# Patient Record
Sex: Female | Born: 1960 | Hispanic: No | Marital: Single | Smoking: Never smoker
Health system: Southern US, Community
[De-identification: ages and names within clinical notes are randomized; demographics above are authoritative.]

## PROBLEM LIST (undated history)

## (undated) DIAGNOSIS — E119 Type 2 diabetes mellitus without complications: Secondary | ICD-10-CM

## (undated) DIAGNOSIS — T7840XA Allergy, unspecified, initial encounter: Secondary | ICD-10-CM

## (undated) DIAGNOSIS — G510 Bell's palsy: Secondary | ICD-10-CM

## (undated) DIAGNOSIS — K219 Gastro-esophageal reflux disease without esophagitis: Secondary | ICD-10-CM

## (undated) DIAGNOSIS — I10 Essential (primary) hypertension: Secondary | ICD-10-CM

## (undated) HISTORY — DX: Gastro-esophageal reflux disease without esophagitis: K21.9

## (undated) HISTORY — PX: CHOLECYSTECTOMY: SHX55

## (undated) HISTORY — PX: TONSILLECTOMY: SUR1361

## (undated) HISTORY — DX: Bell's palsy: G51.0

## (undated) HISTORY — DX: Allergy, unspecified, initial encounter: T78.40XA

## (undated) HISTORY — DX: Type 2 diabetes mellitus without complications: E11.9

## (undated) HISTORY — PX: INCISE AND DRAIN ABCESS: PRO64

## (undated) HISTORY — DX: Essential (primary) hypertension: I10

---

## 2010-02-22 ENCOUNTER — Ambulatory Visit: Payer: Self-pay | Admitting: Vascular Surgery

## 2010-05-25 ENCOUNTER — Ambulatory Visit: Admit: 2010-05-25 | Payer: Self-pay | Admitting: Vascular Surgery

## 2010-05-25 ENCOUNTER — Ambulatory Visit
Admission: RE | Admit: 2010-05-25 | Discharge: 2010-05-25 | Payer: Self-pay | Source: Home / Self Care | Attending: Vascular Surgery | Admitting: Vascular Surgery

## 2010-07-05 ENCOUNTER — Other Ambulatory Visit (INDEPENDENT_AMBULATORY_CARE_PROVIDER_SITE_OTHER): Payer: PRIVATE HEALTH INSURANCE | Admitting: Vascular Surgery

## 2010-07-05 DIAGNOSIS — I83893 Varicose veins of bilateral lower extremities with other complications: Secondary | ICD-10-CM

## 2010-07-05 NOTE — Assessment & Plan Note (Signed)
OFFICE VISIT  Diane Ballard, Diane Ballard. DOB:  Jan 24, 1961                                       07/05/2010 CHART#:[id number]  The patient had laser ablation of her right great saphenous vein from the proximal calf to the saphenofemoral junction with greater than 20 stab phlebectomies in the posterior thigh, anterior thigh, posterior calf and anterior calf.  This was done under tumescent anesthesia for painful varicosities and venous hypertension.  She tolerated the procedure well and will return in 1 week for a venous duplex exam to confirm closure.  Will then be scheduled to have her contralateral left leg treated.    Quita Skye Hart [place], M.D. Electronically Signed  JDL/MEDQ  D:  07/05/2010  T:  07/05/2010  Job:  [id number]

## 2010-07-12 ENCOUNTER — Ambulatory Visit: Payer: Self-pay | Admitting: Vascular Surgery

## 2010-07-13 ENCOUNTER — Ambulatory Visit (INDEPENDENT_AMBULATORY_CARE_PROVIDER_SITE_OTHER): Payer: PRIVATE HEALTH INSURANCE | Admitting: Vascular Surgery

## 2010-07-13 ENCOUNTER — Encounter (INDEPENDENT_AMBULATORY_CARE_PROVIDER_SITE_OTHER): Payer: PRIVATE HEALTH INSURANCE

## 2010-07-13 DIAGNOSIS — I8310 Varicose veins of unspecified lower extremity with inflammation: Secondary | ICD-10-CM

## 2010-07-13 DIAGNOSIS — I83893 Varicose veins of bilateral lower extremities with other complications: Secondary | ICD-10-CM

## 2010-07-13 DIAGNOSIS — Z48812 Encounter for surgical aftercare following surgery on the circulatory system: Secondary | ICD-10-CM

## 2010-07-14 NOTE — Assessment & Plan Note (Signed)
OFFICE VISIT  MONQUE, HAGGAR K. DOB:  1960-07-05                                       07/13/2010 CHART#:[id number]  The patient returns 1 week post laser ablation of the right great saphenous vein with greater than 20 stab phlebectomies for painful varicosities secondary to reflux in the great saphenous system.  She had some mild to moderate discomfort along the course of the great saphenous vein in the mid to proximal thigh which has improved greatly over the last 3 days.  She has had no distal edema or discomfort associated with stab phlebectomy sites.  She is wearing elastic compression stocking and taking ibuprofen as instructed.  On exam, there is mild tenderness along the course of the great saphenous vein in the thigh with mild ecchymosis and nice healing of the stab phlebectomy sites in the calf.  There is no distal edema and she has a 3+ dorsalis pedis pulse.  Today I ordered venous duplex exam which reveals no evidence of DVT in the right leg and total closure of the great saphenous vein from the FSJ to the knee.  I think she is getting along nicely and we will schedule her soon for the same procedure to be done on the contralateral left leg.    Quita Skye Hart [place], M.D. Electronically Signed  JDL/MEDQ  D:  07/13/2010  T:  07/14/2010  Job:  [id number]

## 2010-07-20 NOTE — Procedures (Unsigned)
DUPLEX DEEP VENOUS EXAM - LOWER EXTREMITY  INDICATION:  Followup right greater saphenous vein ablation.  HISTORY:  Edema:  Yes. Trauma/Surgery:  Right GSV ablation. Pain:  Yes. PE:  No. Previous DVT:  No. Anticoagulants:  No. Other:  DUPLEX EXAM:               CFV   SFV   PopV  PTV    GSV               R  L  R  L  R  L  R   L  R  L Thrombosis    o  o  o     o            + Spontaneous   +  +  +     +            o Phasic        +  +  +     +            o Augmentation  +  +  +     +            o Compressible  +  +  +     +            o Competent     +  +  +     +            o  Legend:  + - yes  o - no  p - partial  D - decreased  IMPRESSION: 1. No evidence of deep venous thrombosis in the right lower extremity,     although calf veins could not be imaged due to edema. 2. The great saphenous vein is thrombosed from the saphenofemoral     junction to the knee.  There is an area of clinically significant     reflux noted in the great saphenous vein and its branch located at     the proximal calf level.   _____________________________ Quita Skye. Hart [place], M.D.  EM/MEDQ  D:  07/13/2010  T:  07/13/2010  Job:  [id number]

## 2010-08-30 ENCOUNTER — Other Ambulatory Visit (INDEPENDENT_AMBULATORY_CARE_PROVIDER_SITE_OTHER): Payer: PRIVATE HEALTH INSURANCE | Admitting: Vascular Surgery

## 2010-08-30 ENCOUNTER — Other Ambulatory Visit: Payer: PRIVATE HEALTH INSURANCE | Admitting: Vascular Surgery

## 2010-08-30 DIAGNOSIS — I83893 Varicose veins of bilateral lower extremities with other complications: Secondary | ICD-10-CM

## 2010-08-30 NOTE — Assessment & Plan Note (Signed)
OFFICE VISIT  Diane Ballard, Diane Ballard. DOB:  11-24-1960                                       08/30/2010 CHART#:[id number]  The patient had laser ablation of her left great saphenous vein from proximal calf to the saphenofemoral junction plus 10-20 stab phlebectomies for painful varicosities secondary to valvular incompetence and reflux in the left great saphenous system.  She tolerated the procedure well which was done under local tumescent anesthesia.  She will return in 1 week for venous duplex exam to confirm closure of the left great saphenous vein.    Quita Skye Hart [place], M.D. Electronically Signed  JDL/MEDQ  D:  08/30/2010  T:  08/30/2010  Job:  [id number]

## 2010-09-06 ENCOUNTER — Ambulatory Visit: Payer: PRIVATE HEALTH INSURANCE | Admitting: Vascular Surgery

## 2010-09-07 ENCOUNTER — Ambulatory Visit (INDEPENDENT_AMBULATORY_CARE_PROVIDER_SITE_OTHER): Payer: PRIVATE HEALTH INSURANCE | Admitting: Vascular Surgery

## 2010-09-07 ENCOUNTER — Encounter (INDEPENDENT_AMBULATORY_CARE_PROVIDER_SITE_OTHER): Payer: PRIVATE HEALTH INSURANCE

## 2010-09-07 DIAGNOSIS — Z48812 Encounter for surgical aftercare following surgery on the circulatory system: Secondary | ICD-10-CM

## 2010-09-07 DIAGNOSIS — I83893 Varicose veins of bilateral lower extremities with other complications: Secondary | ICD-10-CM

## 2010-09-08 NOTE — Assessment & Plan Note (Signed)
OFFICE VISIT  Diane, [name]. DOB:  May 31, 1960                                       09/07/2010 CHART#:[id number]  Patient returns 1 week post laser ablation of her left great saphenous vein with 10-20 stab phlebectomies for painful varicosities secondary to valvular incompetence with reflux.  She has had minimal discomfort after this procedure.  She wore the stocking as instructed and is taking the ibuprofen and elevated the leg as instructed.  She states the left leg has had less discomfort than the contralateral right leg, which was done in February.  On physical exam today, blood pressure is 155/72, heart rate 93, respirations 24.  The left leg stab phlebectomy sites are healing nicely.  There is no distal edema.  She has 3+ dorsalis pedis pulse palpable.  Today I ordered a venous duplex exam of the left leg, which revealed no evidence of DVT and total closure of the left great saphenous vein from the distal insertion site of the saphenofemoral junction.  She will return in late May to have sclerotherapy performed to complete her treatment sessions.    Quita Skye Hart [place], M.D. Electronically Signed  JDL/MEDQ  D:  09/07/2010  T:  09/08/2010  Job:  [id number]

## 2010-09-28 NOTE — Consult Note (Signed)
NEW PATIENT CONSULTATION   Ballard, Diane K.  DOB:  [birth year]                                       02/22/2010  CHART#:[id number]   Ms. Diane Ballard is a 50 year old female referred for venous  insufficiency of both lower extremities.  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  She has noted significant darkening of  the skin of both legs over the last for 5 years following a trip to  [place] but she had no history of DVT.  She did have a remote episode of  thrombophlebitis in one of her legs at age 34 but she cannot remember  which.  She has been experiencing aching, throbbing and burning  discomfort in both thigh and calf areas as the day progresses with  swelling in the feet right worse than left and bulging of the  varicosities.  She has no history of bleeding or stasis ulcers.  She  does not wear elastic compression stockings but occasionally will  elevate her legs which makes him feel better.  She takes occasional  ibuprofen.   Chronic medical problems:  1. Type 1 diabetes mellitus.  2. Hypertension.  3. Osteoarthritis.  4. History of Bell's palsy resolved in 4 weeks.  5. Negative coronary artery disease, COPD or hyperlipidemia or stroke.   SOCIAL HISTORY:  She is married and has 2 children and works as a Musician, does not use tobacco or alcohol.   FAMILY HISTORY:  Positive for diabetes and coronary artery disease in  her mother.  Negative for stroke.   REVIEW OF SYSTEMS:  Positive for leg discomfort as noted.  She does have  joint pain but no chest pain, dyspnea on exertion, chronic bronchitis,  wheezing.  Denies any GI or GU symptoms.  All other systems are  negative.   PHYSICAL EXAMINATION:  Blood pressure 156/91, heart rate 95, temperature  97.9.  GENERAL:  Well-developed, well-nourished obese female in no apparent  distress.   She is alert and oriented x3.  HEENT:  Exam normal for age.  EOMs intact.  LUNGS:  Clear to auscultation.  No rhonchi or wheezing.  CARDIOVASCULAR:  Regular rhythm.  No murmurs.  Carotid pulses 3+, no  bruits.  ABDOMEN:  Obese.  No palpable masses.  MUSCULOSKELETAL:  Exam is free of major deformities.  NEUROLOGICAL:  Normal.  Lower extremity exam reveals 3+ femoral, popliteal, and dorsalis pedis  pulses bilaterally.  She has bulging varicosities in both legs with  obvious venous insufficiency with hyperpigmentation and likely  dermatosclerosis in both legs from the mid calf distally particularly  medially.  There are bulging varicosities in the great saphenous system  bilaterally right worse than left and in the right leg, she has  prominent varicosities from the mid thigh extending laterally around the  knee.  There are no active stasis ulcers noted.   I visualized her great saphenous system today with the SonoSite and she  has gross reflux at the saphenofemoral junction bilaterally with large  great saphenous veins.   This lady has severe venous insufficiency with skin changes.  We will  treat her initially with long-leg elastic compression stockings (20 mm-  30 mm gradient) as well as elevation and ibuprofen on  a daily basis.  She will return in 3 months and at that time, we will get a formal  ultrasound performed in the vascular lab and I think would benefit  significantly from laser ablation of both great saphenous veins with  stab phlebectomies and possible treatment of the small saphenous vein  depending on the finding on the ultrasound.     Diane Ballard [place], M.D.  Electronically Signed   JDL/MEDQ  D:  02/22/2010  T:  02/23/2010  Job:  [id number]

## 2010-09-28 NOTE — Assessment & Plan Note (Signed)
OFFICE VISIT   VALERA, VALLAS.  DOB:  1961/01/24                                       05/25/2010  CHART#:[id number]   Patient returns today for follow-up regarding her venous insufficiency  of both lower extremities.  This 50 year old female has been having  symptoms of aching, throbbing, and burning discomfort in both legs for  many years, right worse than left.  She has had no stasis ulcers or  bleeding but does have an area in her right leg very concerning  considering potential bleeding.  She has been wearing long-leg elastic  compression stockings (20 mm - 30 mm gradient) as well as elevating her  legs and trying ibuprofen on a daily basis and has had no improvement in  her symptomatology.  This is definitely affecting her daily living at  the current present time.   On examination, her blood pressure is 123/89, heart rate 109,  respirations 20.  Generally, she is a well-developed, well-nourished  female in no apparent distress.  Lower extremity exam reveals 3+ femoral  and dorsalis pedis pulses bilaterally.  She has hyperpigmentation of  both legs beginning in the mid lower leg, extending down to the ankle.  The right leg has an area in the pretibial region of 2 very superficial  reticular veins which are within the dermis and appear to be potential  sites for bleeding.  She also has bulging varicosities in both legs  along the great saphenous system.   Today I ordered a venous duplex exam which I have reviewed and  interpreted.  Both great saphenous veins have gross reflux from the knee  to the saphenofemoral junction.  The deep systems have no evidence of  DVT.  The small saphenous veins are unremarkable.   RECOMMENDATIONS:  I think this patient should have the following  procedures:  1. Laser ablation of the right great saphenous vein with multiple stab      phlebectomies.  2. One course of sclerotherapy for the superficial reticular  veins to      prevent bleeding on the right leg.  3. Laser ablation of the left great saphenous vein with multiple stab      phlebectomies.   We will proceed with pre-certifying this now to perform in the near  future.     Quita Skye Hart [place], M.D.  Electronically Signed   JDL/MEDQ  D:  05/25/2010  T:  05/25/2010  Job:  [id number]

## 2010-09-28 NOTE — Procedures (Signed)
LOWER EXTREMITY VENOUS REFLUX EXAM   INDICATION:  Varicose veins.   EXAM:  Using color-flow imaging and pulse Doppler spectral analysis, the  right and left common femoral, superficial femoral, popliteal, posterior  tibial, greater and lesser saphenous veins were evaluated.  There is  evidence suggesting deep venous insufficiency in the right and left  lower extremity.   The right and left saphenofemoral junction is not competent with reflux  of >500 milliseconds.  The right and left GSV are not competent with  reflux of >500 milliseconds with the caliber as described below.   The right and left proximal short saphenous veins demonstrate  competency.   GSV Diameter (used if found to be incompetent only)                                            Right    Left  Proximal Greater Saphenous Vein           1.07 cm  0.91 cm  Proximal-to-mid-thigh                     0.87 cm  0.64 cm  Mid thigh                                 0.64 cm  0.59 cm  Mid-distal thigh                          cm       cm  Distal thigh                              0.66 cm  0.56 cm  Knee                                      cm       cm   IMPRESSION:  1. Right and left greater saphenous vein is not competent with reflux      of >500 milliseconds.  2. The right and left greater saphenous vein is not tortuous.  3. The deep venous system is not competent with reflux of >500      milliseconds.  4. The right and left short saphenous veins are competent.   ___________________________________________  Quita Skye. Hart [place], M.D.   OD/MEDQ  D:  05/25/2010  T:  05/25/2010  Job:  [id number]

## 2010-09-30 NOTE — Procedures (Unsigned)
DUPLEX DEEP VENOUS EXAM - LOWER EXTREMITY  INDICATION:  Status post left greater saphenous vein ablation 08/30/2010.  HISTORY:  Edema:  No. Trauma/Surgery:  Status post left greater saphenous vein ablation 08/30/2010. Pain:  Yes. PE:  No. Previous DVT:  No. Anticoagulants:  No. Other:  DUPLEX EXAM:               CFV   SFV   PopV  PTV    GSV               R  L  R  L  R  L  R   L  R  L Thrombosis    o  o     o     o      o     + Spontaneous   +  +     +     +      +     o Phasic        +  +     +     +      +     o Augmentation  +  +     +     +      +     o Compressible  +  +     +     +      +     o Competent     +  +     +     +      +     +  Legend:  + - yes  o - no  p - partial  D - decreased  IMPRESSION:  No evidence of acute deep vein thrombosis within the left lower extremity.  Successful left greater saphenous vein ablation from the saphenofemoral junction to the distal insertion site.   _____________________________ Quita Skye. Hart [place], M.D.  OD/MEDQ  D:  09/07/2010  T:  09/07/2010  Job:  [id number]

## 2010-10-05 ENCOUNTER — Ambulatory Visit (INDEPENDENT_AMBULATORY_CARE_PROVIDER_SITE_OTHER): Payer: PRIVATE HEALTH INSURANCE

## 2010-10-05 DIAGNOSIS — I83893 Varicose veins of bilateral lower extremities with other complications: Secondary | ICD-10-CM

## 2010-10-07 ENCOUNTER — Ambulatory Visit: Payer: PRIVATE HEALTH INSURANCE

## 2017-09-13 ENCOUNTER — Encounter: Payer: Self-pay | Admitting: Gastroenterology

## 2017-10-11 ENCOUNTER — Ambulatory Visit (AMBULATORY_SURGERY_CENTER): Payer: Self-pay

## 2017-10-11 ENCOUNTER — Other Ambulatory Visit: Payer: Self-pay

## 2017-10-11 VITALS — Ht 63.0 in | Wt 213.8 lb

## 2017-10-11 DIAGNOSIS — Z8371 Family history of colonic polyps: Secondary | ICD-10-CM

## 2017-10-11 MED ORDER — NA SULFATE-K SULFATE-MG SULF 17.5-3.13-1.6 GM/177ML PO SOLN: 1.0000 | Freq: Once | ORAL | 0 refills | Status: AC

## 2017-10-11 NOTE — Progress Notes (Signed)
Denies allergies to eggs or soy products. Denies complication of anesthesia or sedation. Denies use of weight loss medication. Denies use of O2.   Emmi instructions declined.  

## 2017-10-25 ENCOUNTER — Encounter: Payer: PRIVATE HEALTH INSURANCE | Admitting: Gastroenterology

## 2020-02-25 ENCOUNTER — Other Ambulatory Visit: Payer: Self-pay | Admitting: Ophthalmology

## 2020-02-27 ENCOUNTER — Other Ambulatory Visit: Payer: Self-pay | Admitting: Ophthalmology

## 2020-02-27 DIAGNOSIS — H4921 Sixth [abducent] nerve palsy, right eye: Secondary | ICD-10-CM

## 2020-03-25 ENCOUNTER — Ambulatory Visit
Admission: RE | Admit: 2020-03-25 | Discharge: 2020-03-25 | Disposition: A | Discharge status: Home / Self Care | Payer: Commercial Managed Care - PPO | Source: Ambulatory Visit | Attending: Ophthalmology | Admitting: Ophthalmology

## 2020-03-25 DIAGNOSIS — H4921 Sixth [abducent] nerve palsy, right eye: Secondary | ICD-10-CM

## 2020-03-25 MED ORDER — GADOBENATE DIMEGLUMINE 529 MG/ML IV SOLN
20.0000 mL | Freq: Once | INTRAVENOUS | Status: AC | PRN
  Administered 2020-03-25: 20 mL via INTRAVENOUS

## 2021-03-10 IMAGING — MR MR ORBITS WO/W CM
4 of 7 series · 19 of 48 positions shown · IV contrast (multihance)
Comparison: CT head/orbits 05/10/2018.

CLINICAL DATA: Provided history: 6 nerve palsy of right eye.
Additional history provided by scanning technologist: Patient
reports diplopia for 5 weeks.

EXAM:
MRI OF THE ORBITS WITHOUT AND WITH CONTRAST
TECHNIQUE: Multiplanar, multisequence MR imaging of the orbits was performed
both before and after the administration of intravenous contrast.
CONTRAST:  20mL MULTIHANCE GADOBENATE DIMEGLUMINE 529 MG/ML IV SOLN

[Series 2: T1 · sagittal · 5.0mm · 0.45mm/px · 3 of 20 slices shown (1 of 2)]
[im 4/20]
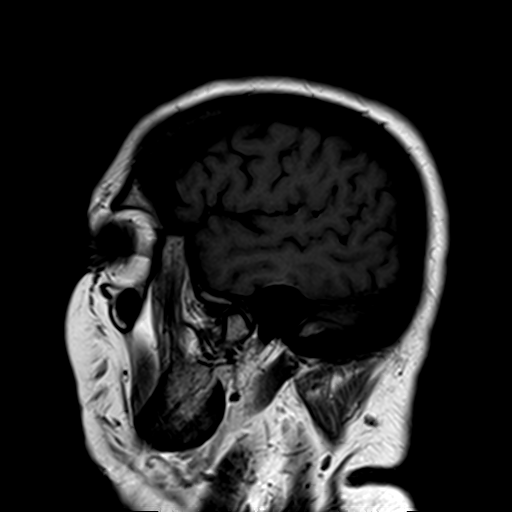
[im 12/20]
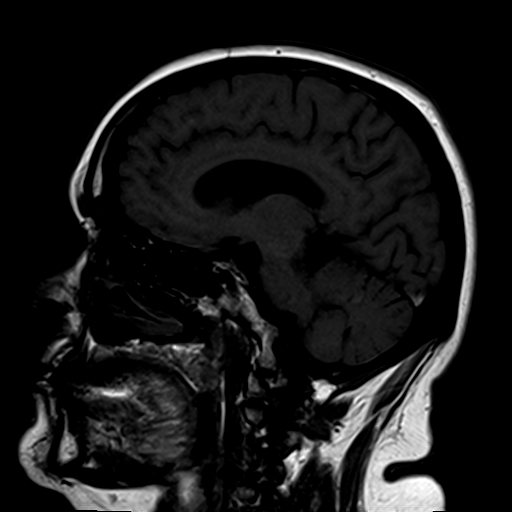
[im 20/20]
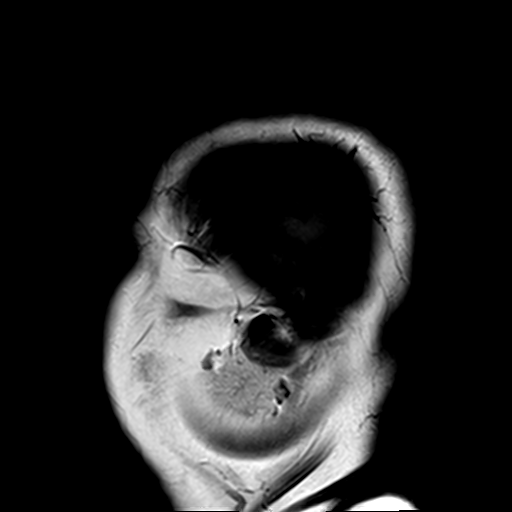

[Series 3: T1 · coronal · 3.0mm · 0.35mm/px · 3 of 23 slices shown (2 of 2)]
[im 4/23]
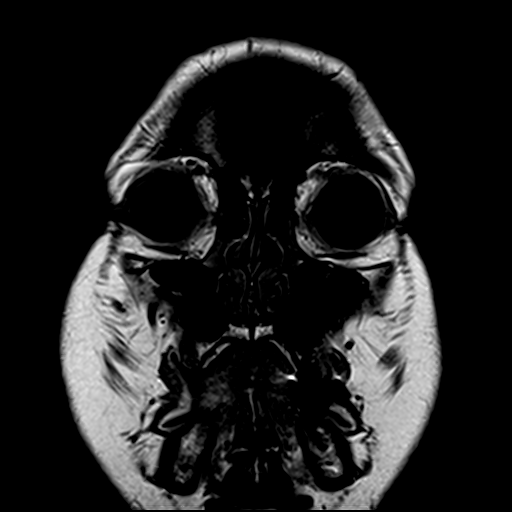
[im 13/23]
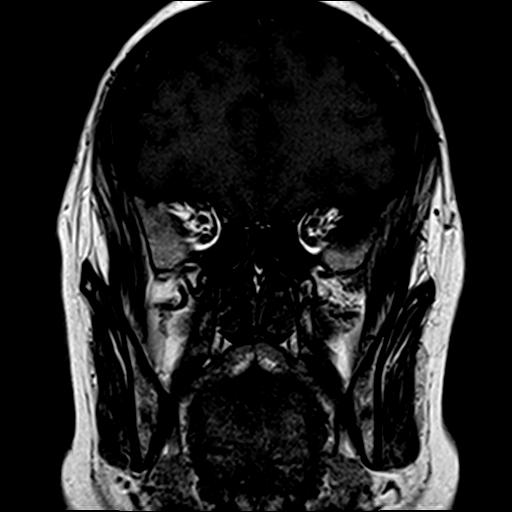
[im 19/23]
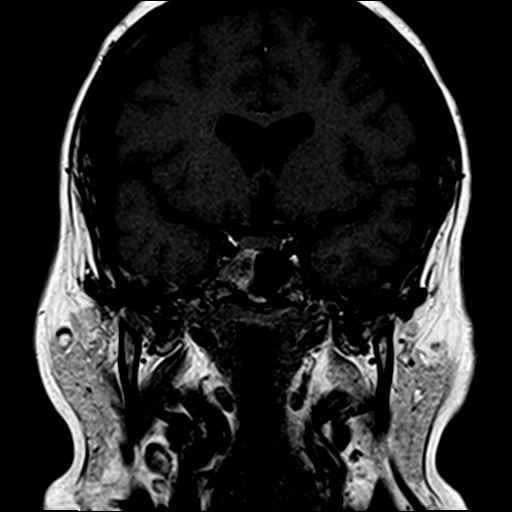

[Series 5: T2 fat-sat · coronal · 3.0mm · 0.35mm/px · 8 of 23 slices shown (1 of 2)]
[im 1/23]
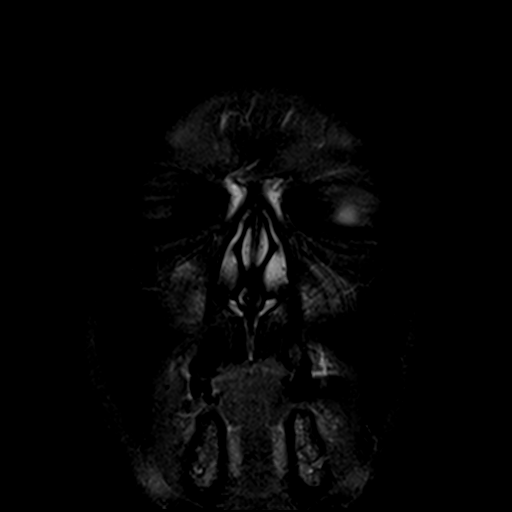
[im 4/23]
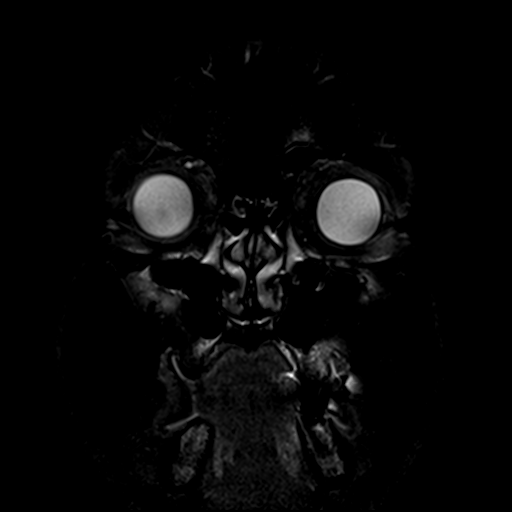
[im 7/23]
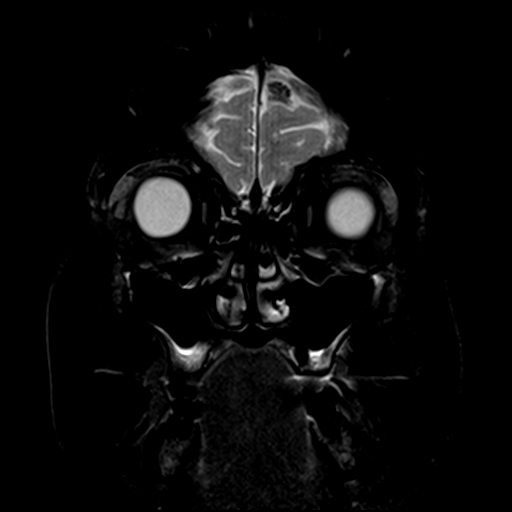
[im 10/23]
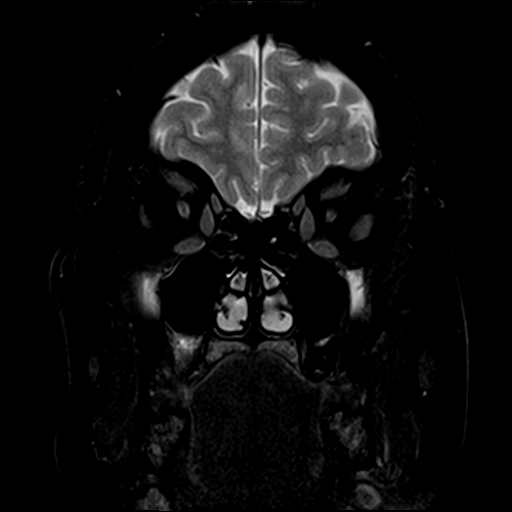
[im 13/23]
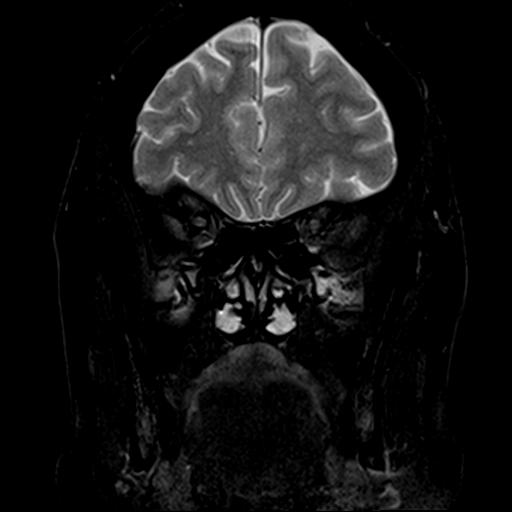
[im 16/23]
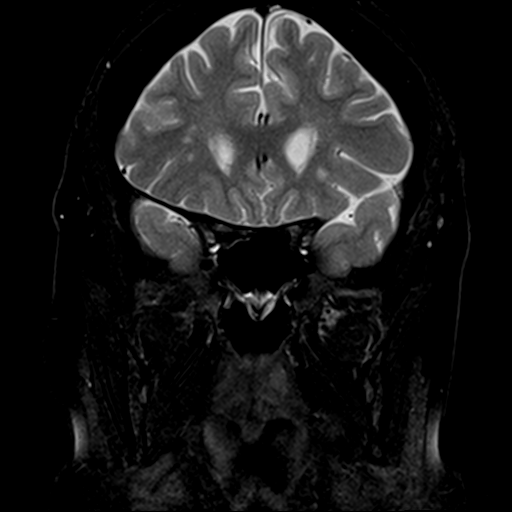
[im 19/23]
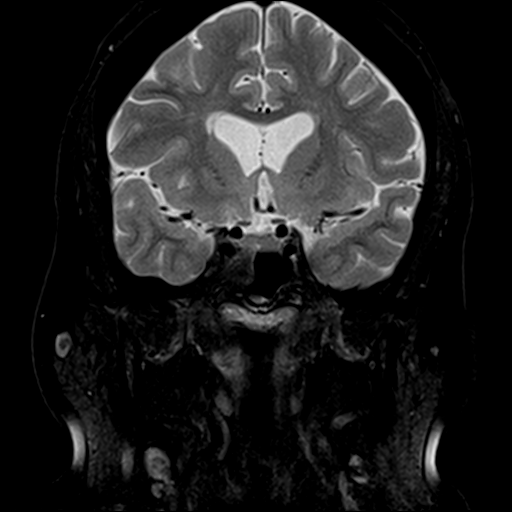
[im 23/23]
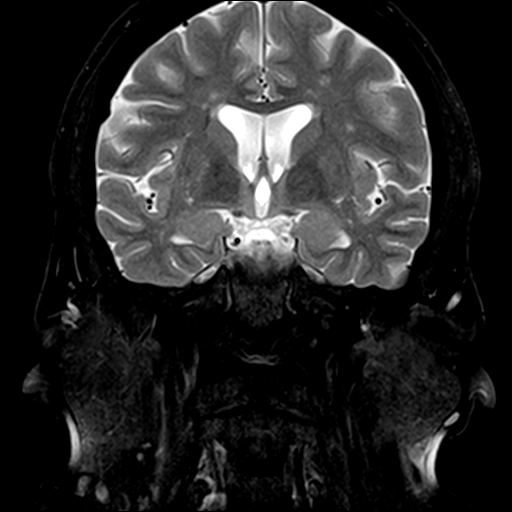

[Series 6: T2 fat-sat · axial · 3.0mm · 0.35mm/px · z∈[-22,+33]mm · 5 of 18 slices shown (2 of 2)]
[im 1/18]
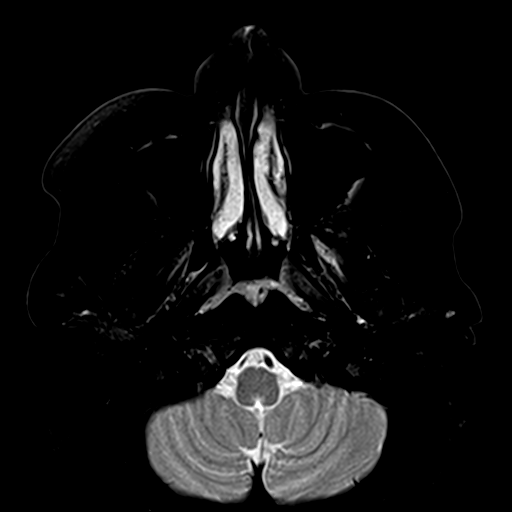
[im 4/18]
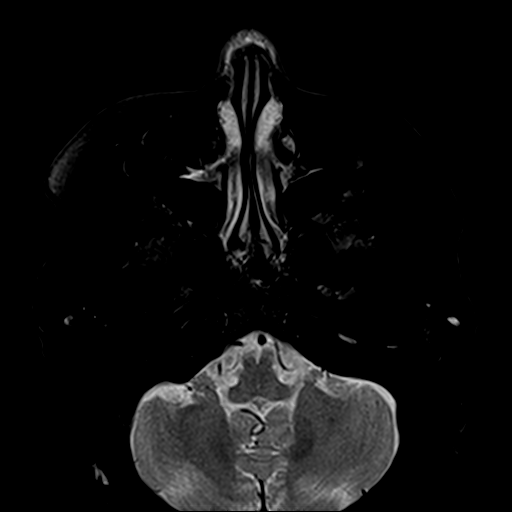
[im 7/18]
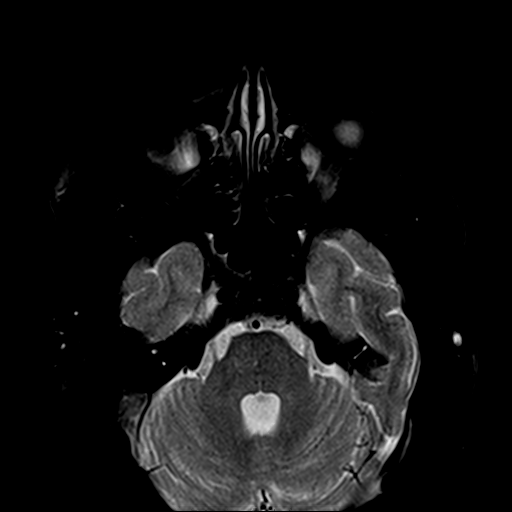
[im 11/18]
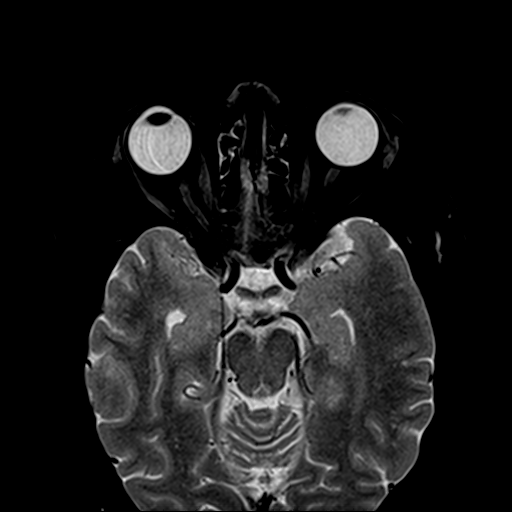
[im 18/18]
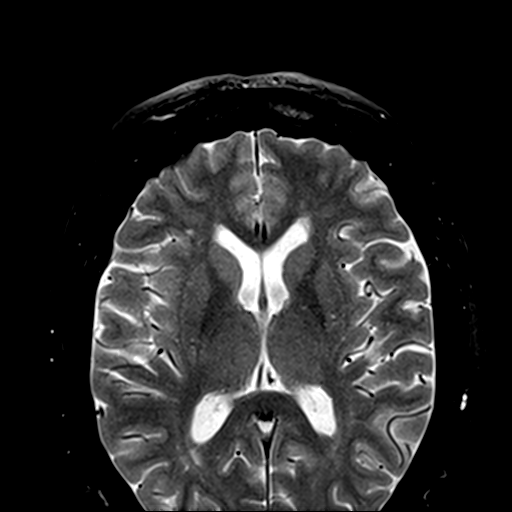

[19 of 48 positions shown; findings below may reference images not displayed]

FINDINGS: The globes are normal in size and contour. The extraocular muscles
and optic nerve sheath complexes are symmetric and unremarkable. No
intraorbital mass or abnormal intraorbital enhancement is
identified. Specifically, the previously demonstrated subtle
asymmetric prominence and enhancement of the left medial rectus
muscle is no longer appreciated.

4 mm ovoid hypoenhancing lesion within the paramedian right
pituitary gland (series 7, image 4). The suprasellar cistern is
patent. No mass effect upon the optic chiasm.

Mild ethmoid sinus mucosal thickening. The visualized maxillofacial
and upper neck soft tissues are unremarkable.
IMPRESSION: Unremarkable MRI appearance of the orbits with and without contrast.
Specifically, the previously demonstrated subtle asymmetric
prominence and enhancement of the left medial rectus muscle is no
longer appreciated.

4 mm ovoid hypoenhancing lesion within the paramedian right
pituitary gland. In retrospect, this finding was present on the CT
orbits exam of 05/10/2018, and is unchanged in size since that time.
Primary differential considerations include a pituitary cyst or
cystic microadenoma. Correlate with relevant laboratory values. A
dedicated pituitary protocol brain MRI may be obtained for further
characterization if clinically warranted.

Mild ethmoid sinus mucosal thickening.
